# Patient Record
Sex: Male | Born: 1985 | Race: White | Hispanic: No | Smoking: Current every day smoker
Health system: Southern US, Community
[De-identification: ages and names within clinical notes are randomized; demographics above are authoritative.]

## PROBLEM LIST (undated history)

## (undated) DIAGNOSIS — F17200 Nicotine dependence, unspecified, uncomplicated: Secondary | ICD-10-CM

---
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
## 2007-10-06 ENCOUNTER — Emergency Department: Payer: Self-pay | Admitting: Emergency Medicine

## 2009-07-12 ENCOUNTER — Emergency Department: Payer: Self-pay | Admitting: Internal Medicine

## 2011-04-11 IMAGING — CR DG KNEE COMPLETE 4+V*L*
1 series · 5 of 5 positions shown · non-contrast
Comparison: none

REASON FOR EXAM: pain, fall
COMMENTS:

PROCEDURE:     DXR - DXR KNEE LT COMP WITH OBLIQUES  - July 12, 2009 [DATE]
RESULT:     No fracture, dislocation or other acute bony abnormality is
identified. The knee joint space is well maintained. The patella is intact.

[Series 1: view not recorded · 0.17mm/px · 5 of 5 slices shown]
[im 1/5]
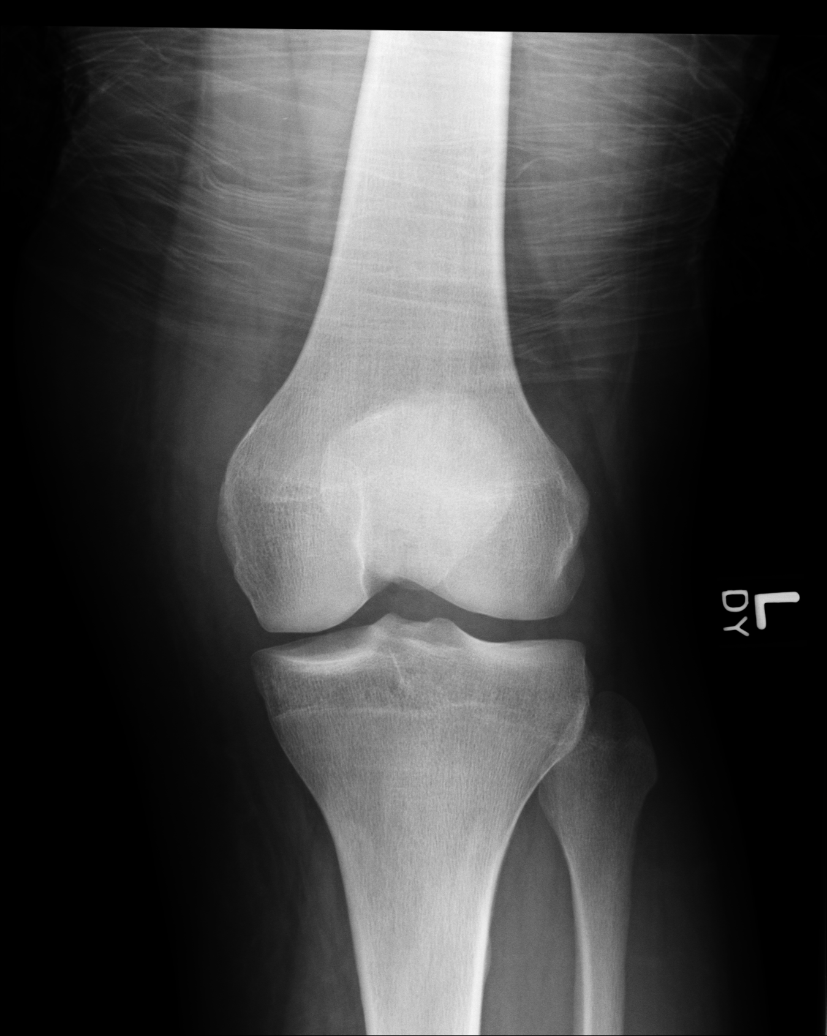
[im 2/5]
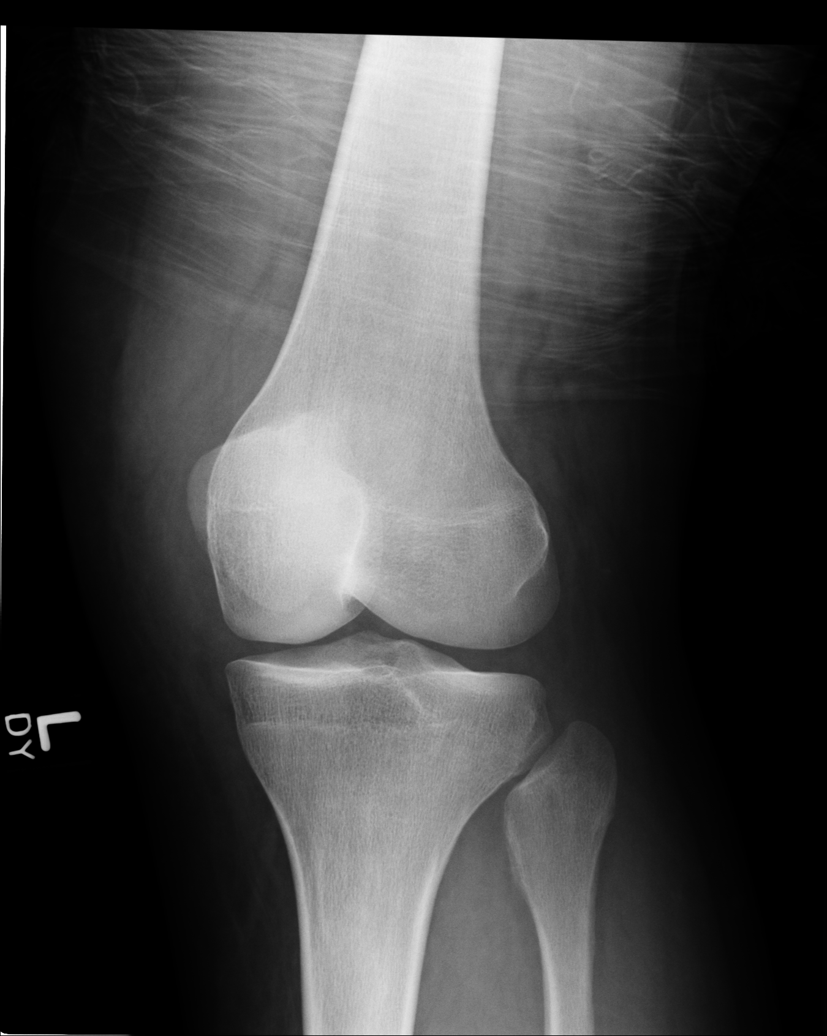
[im 3/5]
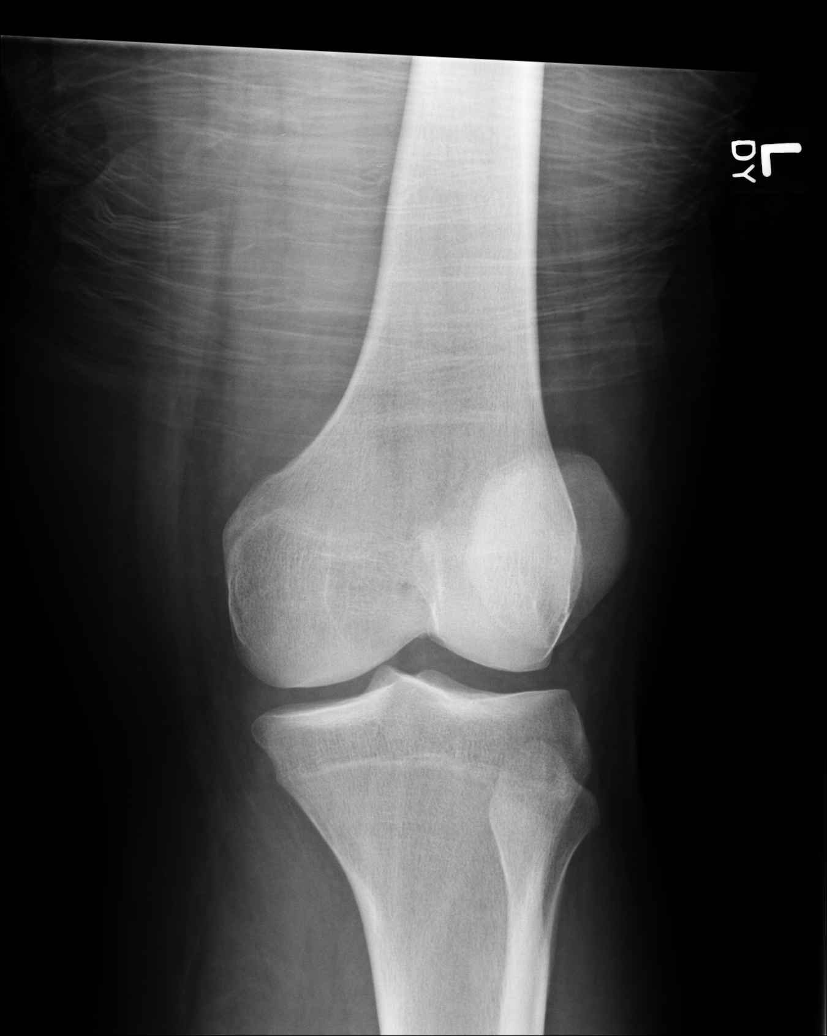
[im 4/5]
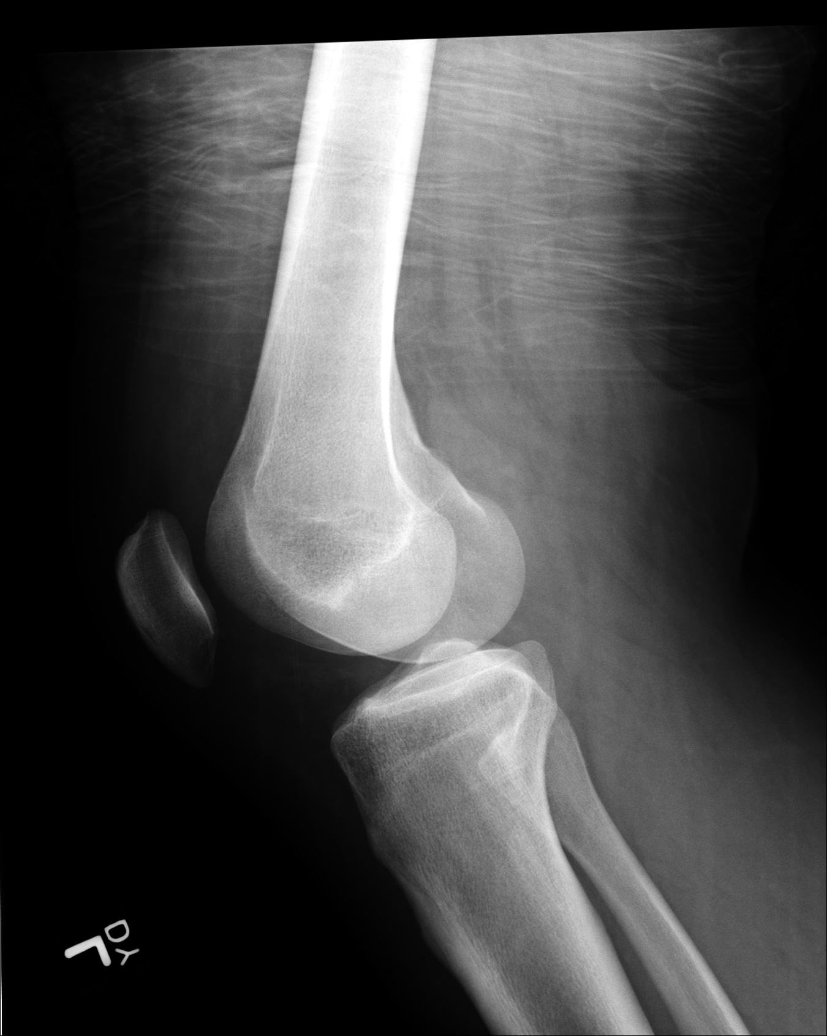
[im 5/5]
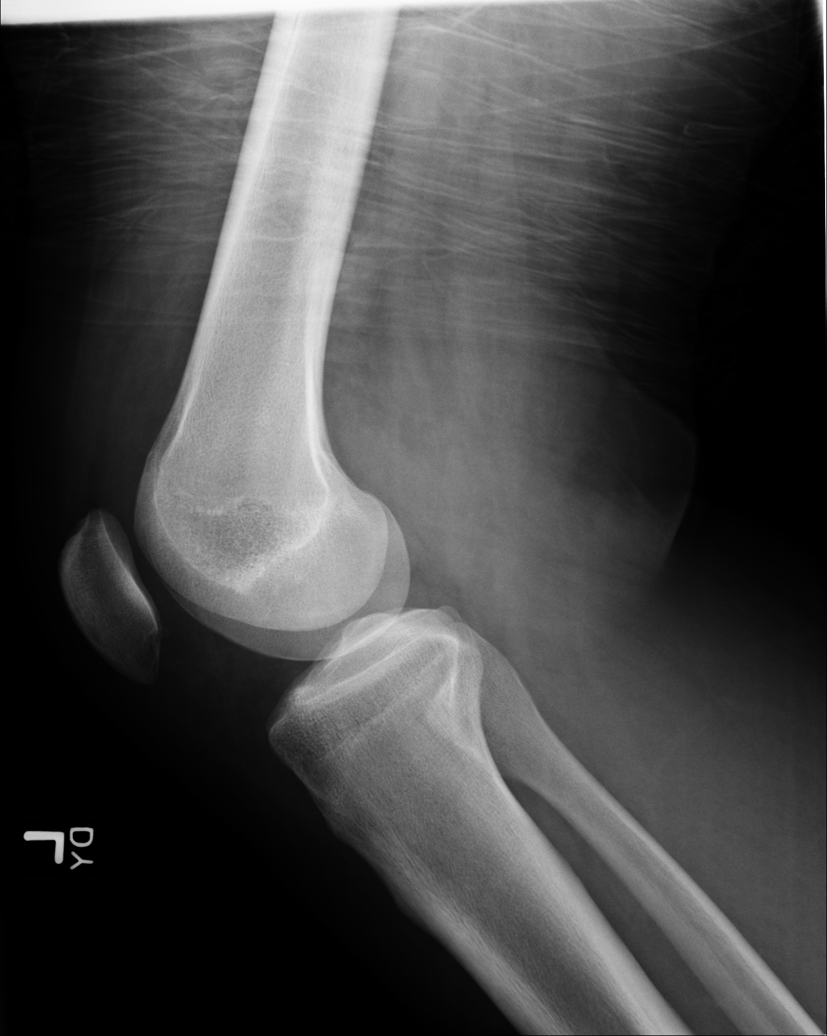

[5 of 5 positions shown; findings below may reference images not displayed]

IMPRESSION: 1.     No acute bony abnormalities are identified.

## 2015-12-06 ENCOUNTER — Encounter: Payer: Self-pay | Admitting: Emergency Medicine

## 2015-12-06 ENCOUNTER — Emergency Department: Payer: No Typology Code available for payment source

## 2015-12-06 ENCOUNTER — Emergency Department
Admission: EM | Admit: 2015-12-06 | Discharge: 2015-12-06 | Disposition: A | Discharge status: Home / Self Care | Payer: No Typology Code available for payment source | Attending: Student | Admitting: Student

## 2015-12-06 DIAGNOSIS — Y9355 Activity, bike riding: Secondary | ICD-10-CM | POA: Diagnosis not present

## 2015-12-06 DIAGNOSIS — Y92410 Unspecified street and highway as the place of occurrence of the external cause: Secondary | ICD-10-CM | POA: Diagnosis not present

## 2015-12-06 DIAGNOSIS — Y999 Unspecified external cause status: Secondary | ICD-10-CM | POA: Insufficient documentation

## 2015-12-06 DIAGNOSIS — F17200 Nicotine dependence, unspecified, uncomplicated: Secondary | ICD-10-CM | POA: Diagnosis not present

## 2015-12-06 DIAGNOSIS — S9031XA Contusion of right foot, initial encounter: Secondary | ICD-10-CM | POA: Diagnosis not present

## 2015-12-06 DIAGNOSIS — S99921A Unspecified injury of right foot, initial encounter: Secondary | ICD-10-CM | POA: Diagnosis present

## 2015-12-06 MED ORDER — MELOXICAM 15 MG PO TABS: 15.0000 mg | ORAL_TABLET | Freq: Every day | ORAL | 0 refills | Status: AC
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
## 2016-03-18 ENCOUNTER — Encounter: Payer: Self-pay | Admitting: Emergency Medicine

## 2016-03-18 ENCOUNTER — Emergency Department: Payer: No Typology Code available for payment source

## 2016-03-18 ENCOUNTER — Emergency Department
Admission: EM | Admit: 2016-03-18 | Discharge: 2016-03-18 | Disposition: A | Discharge status: Home / Self Care | Payer: No Typology Code available for payment source | Attending: Emergency Medicine | Admitting: Emergency Medicine

## 2016-03-18 DIAGNOSIS — S5002XA Contusion of left elbow, initial encounter: Secondary | ICD-10-CM

## 2016-03-18 DIAGNOSIS — S161XXA Strain of muscle, fascia and tendon at neck level, initial encounter: Secondary | ICD-10-CM | POA: Insufficient documentation

## 2016-03-18 DIAGNOSIS — S060X9A Concussion with loss of consciousness of unspecified duration, initial encounter: Secondary | ICD-10-CM | POA: Diagnosis not present

## 2016-03-18 DIAGNOSIS — F17210 Nicotine dependence, cigarettes, uncomplicated: Secondary | ICD-10-CM | POA: Insufficient documentation

## 2016-03-18 DIAGNOSIS — Y92410 Unspecified street and highway as the place of occurrence of the external cause: Secondary | ICD-10-CM | POA: Diagnosis not present

## 2016-03-18 DIAGNOSIS — Z791 Long term (current) use of non-steroidal anti-inflammatories (NSAID): Secondary | ICD-10-CM | POA: Diagnosis not present

## 2016-03-18 DIAGNOSIS — Y9355 Activity, bike riding: Secondary | ICD-10-CM | POA: Diagnosis not present

## 2016-03-18 DIAGNOSIS — S40012A Contusion of left shoulder, initial encounter: Secondary | ICD-10-CM | POA: Diagnosis not present

## 2016-03-18 DIAGNOSIS — S0990XA Unspecified injury of head, initial encounter: Secondary | ICD-10-CM | POA: Diagnosis present

## 2016-03-18 DIAGNOSIS — S7002XA Contusion of left hip, initial encounter: Secondary | ICD-10-CM | POA: Insufficient documentation

## 2016-03-18 DIAGNOSIS — Y999 Unspecified external cause status: Secondary | ICD-10-CM | POA: Diagnosis not present

## 2016-03-18 MED ORDER — OXYCODONE-ACETAMINOPHEN 5-325 MG PO TABS: 1.0000 | ORAL_TABLET | Freq: Four times a day (QID) | ORAL | 0 refills | Status: AC | PRN

## 2016-03-18 MED ORDER — OXYCODONE-ACETAMINOPHEN 5-325 MG PO TABS
ORAL_TABLET | ORAL | Status: AC
  Administered 2016-03-18: 1 via ORAL
  Filled 2016-03-18: qty 1

## 2016-03-18 MED ORDER — OXYCODONE-ACETAMINOPHEN 5-325 MG PO TABS
1.0000 | ORAL_TABLET | Freq: Once | ORAL | Status: AC
  Administered 2016-03-18: 1 via ORAL

## 2016-03-18 MED ORDER — IBUPROFEN 800 MG PO TABS: 800.0000 mg | ORAL_TABLET | Freq: Three times a day (TID) | ORAL | 0 refills | Status: AC | PRN

## 2016-03-18 MED ORDER — CYCLOBENZAPRINE HCL 10 MG PO TABS
ORAL_TABLET | ORAL | Status: AC
  Administered 2016-03-18: 10 mg via ORAL
  Filled 2016-03-18: qty 1

## 2016-03-18 MED ORDER — CYCLOBENZAPRINE HCL 5 MG PO TABS: 5.0000 mg | ORAL_TABLET | Freq: Three times a day (TID) | ORAL | 0 refills | Status: AC | PRN

## 2016-03-18 MED ORDER — CYCLOBENZAPRINE HCL 10 MG PO TABS
10.0000 mg | ORAL_TABLET | Freq: Once | ORAL | Status: AC
  Administered 2016-03-18: 10 mg via ORAL

## 2016-03-18 NOTE — Discharge Instructions (Signed)
Please take medications as prescribed. Ice elbow, shoulder, hip 20 minutes every hour as needed for the next 2-3 days. Wear sling for 2-3 days as needed for comfort. Follow-up with orthopedics if no improvement in 5-7 days. Return to the ER for any worsening symptoms urgent changes in her health.
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
## 2016-07-17 ENCOUNTER — Encounter (HOSPITAL_COMMUNITY): Payer: Self-pay

## 2016-07-17 ENCOUNTER — Emergency Department (HOSPITAL_COMMUNITY): Payer: Medicaid Other

## 2016-07-17 ENCOUNTER — Inpatient Hospital Stay (HOSPITAL_COMMUNITY)
Admission: EM | Admit: 2016-07-17 | Discharge: 2016-08-15 | DRG: 987 | Disposition: E | Payer: Medicaid Other | Attending: General Surgery | Admitting: General Surgery

## 2016-07-17 DIAGNOSIS — S069X9A Unspecified intracranial injury with loss of consciousness of unspecified duration, initial encounter: Secondary | ICD-10-CM

## 2016-07-17 DIAGNOSIS — R402112 Coma scale, eyes open, never, at arrival to emergency department: Secondary | ICD-10-CM | POA: Diagnosis present

## 2016-07-17 DIAGNOSIS — S065X9A Traumatic subdural hemorrhage with loss of consciousness of unspecified duration, initial encounter: Secondary | ICD-10-CM

## 2016-07-17 DIAGNOSIS — J9601 Acute respiratory failure with hypoxia: Secondary | ICD-10-CM | POA: Diagnosis present

## 2016-07-17 DIAGNOSIS — Z529 Donor of unspecified organ or tissue: Secondary | ICD-10-CM

## 2016-07-17 DIAGNOSIS — S065XAA Traumatic subdural hemorrhage with loss of consciousness status unknown, initial encounter: Secondary | ICD-10-CM

## 2016-07-17 DIAGNOSIS — I609 Nontraumatic subarachnoid hemorrhage, unspecified: Secondary | ICD-10-CM

## 2016-07-17 DIAGNOSIS — S069XAA Unspecified intracranial injury with loss of consciousness status unknown, initial encounter: Secondary | ICD-10-CM

## 2016-07-17 DIAGNOSIS — W3400XA Accidental discharge from unspecified firearms or gun, initial encounter: Secondary | ICD-10-CM | POA: Diagnosis not present

## 2016-07-17 DIAGNOSIS — G936 Cerebral edema: Secondary | ICD-10-CM | POA: Diagnosis present

## 2016-07-17 DIAGNOSIS — S069X6A Unspecified intracranial injury with loss of consciousness greater than 24 hours without return to pre-existing conscious level with patient surviving, initial encounter: Secondary | ICD-10-CM

## 2016-07-17 DIAGNOSIS — R402212 Coma scale, best verbal response, none, at arrival to emergency department: Secondary | ICD-10-CM | POA: Diagnosis present

## 2016-07-17 DIAGNOSIS — T17590A Other foreign object in bronchus causing asphyxiation, initial encounter: Secondary | ICD-10-CM | POA: Diagnosis present

## 2016-07-17 DIAGNOSIS — R402312 Coma scale, best motor response, none, at arrival to emergency department: Secondary | ICD-10-CM | POA: Diagnosis present

## 2016-07-17 DIAGNOSIS — S061X9A Traumatic cerebral edema with loss of consciousness of unspecified duration, initial encounter: Secondary | ICD-10-CM | POA: Diagnosis present

## 2016-07-17 DIAGNOSIS — S0193XA Puncture wound without foreign body of unspecified part of head, initial encounter: Secondary | ICD-10-CM

## 2016-07-17 DIAGNOSIS — G9382 Brain death: Secondary | ICD-10-CM | POA: Diagnosis present

## 2016-07-17 HISTORY — DX: Nicotine dependence, unspecified, uncomplicated: F17.200

## 2016-07-17 LAB — CDS SEROLOGY

## 2016-07-17 LAB — TYPE AND SCREEN
ABO/RH(D): O POS
Antibody Screen: NEGATIVE
UNIT DIVISION: 0
Unit division: 0

## 2016-07-17 LAB — COMPREHENSIVE METABOLIC PANEL
ALT: 42 U/L (ref 17–63)
ANION GAP: 7 (ref 5–15)
AST: 32 U/L (ref 15–41)
Albumin: 3.5 g/dL (ref 3.5–5.0)
Alkaline Phosphatase: 47 U/L (ref 38–126)
BUN: 15 mg/dL (ref 6–20)
CALCIUM: 8.1 mg/dL — AB (ref 8.9–10.3)
CHLORIDE: 109 mmol/L (ref 101–111)
CO2: 24 mmol/L (ref 22–32)
Creatinine, Ser: 0.88 mg/dL (ref 0.61–1.24)
Glucose, Bld: 124 mg/dL — ABNORMAL HIGH (ref 65–99)
Potassium: 4 mmol/L (ref 3.5–5.1)
SODIUM: 140 mmol/L (ref 135–145)
Total Bilirubin: 0.9 mg/dL (ref 0.3–1.2)
Total Protein: 5.9 g/dL — ABNORMAL LOW (ref 6.5–8.1)

## 2016-07-17 LAB — URINALYSIS, ROUTINE W REFLEX MICROSCOPIC
Bilirubin Urine: NEGATIVE
GLUCOSE, UA: NEGATIVE mg/dL
HGB URINE DIPSTICK: NEGATIVE
Ketones, ur: 20 mg/dL — AB
LEUKOCYTES UA: NEGATIVE
NITRITE: NEGATIVE
PH: 5 (ref 5.0–8.0)
Protein, ur: 100 mg/dL — AB
SPECIFIC GRAVITY, URINE: 1.028 (ref 1.005–1.030)

## 2016-07-17 LAB — CBC
HCT: 42.5 % (ref 39.0–52.0)
HEMOGLOBIN: 13.9 g/dL (ref 13.0–17.0)
MCH: 28.5 pg (ref 26.0–34.0)
MCHC: 32.7 g/dL (ref 30.0–36.0)
MCV: 87.1 fL (ref 78.0–100.0)
PLATELETS: 163 10*3/uL (ref 150–400)
RBC: 4.88 MIL/uL (ref 4.22–5.81)
RDW: 14.2 % (ref 11.5–15.5)
WBC: 14.9 10*3/uL — AB (ref 4.0–10.5)

## 2016-07-17 LAB — BPAM RBC
BLOOD PRODUCT EXPIRATION DATE: 201804252359
BLOOD PRODUCT EXPIRATION DATE: 201804252359
ISSUE DATE / TIME: 201804021105
ISSUE DATE / TIME: 201804021105
UNIT TYPE AND RH: 9500
Unit Type and Rh: 9500

## 2016-07-17 LAB — PROTIME-INR
INR: 1.25
Prothrombin Time: 15.8 seconds — ABNORMAL HIGH (ref 11.4–15.2)

## 2016-07-17 LAB — PREPARE FRESH FROZEN PLASMA
Unit division: 0
Unit division: 0

## 2016-07-17 LAB — BLOOD GAS, ARTERIAL
Acid-base deficit: 2.3 mmol/L — ABNORMAL HIGH (ref 0.0–2.0)
Bicarbonate: 21 mmol/L (ref 20.0–28.0)
DRAWN BY: 40662
FIO2: 50
MECHVT: 500 mL
O2 Saturation: 99.3 %
PEEP: 5 cmH2O
Patient temperature: 97.8
RATE: 18 resp/min
pCO2 arterial: 29.3 mmHg — ABNORMAL LOW (ref 32.0–48.0)
pH, Arterial: 7.466 — ABNORMAL HIGH (ref 7.350–7.450)
pO2, Arterial: 162 mmHg — ABNORMAL HIGH (ref 83.0–108.0)

## 2016-07-17 LAB — I-STAT ARTERIAL BLOOD GAS, ED
Acid-base deficit: 3 mmol/L — ABNORMAL HIGH (ref 0.0–2.0)
Bicarbonate: 23.6 mmol/L (ref 20.0–28.0)
O2 Saturation: 100 %
PH ART: 7.331 — AB (ref 7.350–7.450)
TCO2: 25 mmol/L (ref 0–100)
pCO2 arterial: 44.2 mmHg (ref 32.0–48.0)
pO2, Arterial: 482 mmHg — ABNORMAL HIGH (ref 83.0–108.0)

## 2016-07-17 LAB — I-STAT CHEM 8, ED
BUN: 19 mg/dL (ref 6–20)
CALCIUM ION: 1.11 mmol/L — AB (ref 1.15–1.40)
CREATININE: 0.8 mg/dL (ref 0.61–1.24)
Chloride: 105 mmol/L (ref 101–111)
GLUCOSE: 125 mg/dL — AB (ref 65–99)
HCT: 40 % (ref 39.0–52.0)
HEMOGLOBIN: 13.6 g/dL (ref 13.0–17.0)
Potassium: 4 mmol/L (ref 3.5–5.1)
Sodium: 140 mmol/L (ref 135–145)
TCO2: 25 mmol/L (ref 0–100)

## 2016-07-17 LAB — TRIGLYCERIDES: TRIGLYCERIDES: 59 mg/dL (ref <150)

## 2016-07-17 LAB — MRSA PCR SCREENING: MRSA BY PCR: POSITIVE — AB

## 2016-07-17 LAB — BPAM FFP
Blood Product Expiration Date: 201804072359
Blood Product Expiration Date: 201804072359
ISSUE DATE / TIME: 201804021106
ISSUE DATE / TIME: 201804021106
UNIT TYPE AND RH: 6200
Unit Type and Rh: 6200

## 2016-07-17 LAB — SURGICAL PCR SCREEN
MRSA, PCR: POSITIVE — AB
Staphylococcus aureus: POSITIVE — AB

## 2016-07-17 LAB — I-STAT CG4 LACTIC ACID, ED: Lactic Acid, Venous: 1.04 mmol/L (ref 0.5–1.9)

## 2016-07-17 LAB — ABO/RH: ABO/RH(D): O POS

## 2016-07-17 LAB — ETHANOL

## 2016-07-17 MED ORDER — ONDANSETRON HCL 4 MG/2ML IJ SOLN: 4.0000 mg | Freq: Four times a day (QID) | INTRAMUSCULAR | Status: DC | PRN

## 2016-07-17 MED ORDER — ETOMIDATE 2 MG/ML IV SOLN
INTRAVENOUS | Status: AC | PRN
  Administered 2016-07-17: 20 mg via INTRAVENOUS

## 2016-07-17 MED ORDER — SODIUM CHLORIDE 0.9 % IV SOLN
0.0000 ug/min | INTRAVENOUS | Status: DC
  Administered 2016-07-17: 20 ug/min via INTRAVENOUS
  Administered 2016-07-18: 120 ug/min via INTRAVENOUS
  Administered 2016-07-18 (×2): 80 ug/min via INTRAVENOUS
  Administered 2016-07-18: 50 ug/min via INTRAVENOUS
  Administered 2016-07-18: 90 ug/min via INTRAVENOUS
  Administered 2016-07-18: 120 ug/min via INTRAVENOUS
  Filled 2016-07-17 (×7): qty 1

## 2016-07-17 MED ORDER — MUPIROCIN 2 % EX OINT
1.0000 "application " | TOPICAL_OINTMENT | Freq: Two times a day (BID) | CUTANEOUS | Status: DC
  Administered 2016-07-17 – 2016-07-19 (×5): 1 via NASAL
  Filled 2016-07-17: qty 22

## 2016-07-17 MED ORDER — ATROPINE SULFATE 1 MG/ML IJ SOLN
INTRAMUSCULAR | Status: AC | PRN
  Administered 2016-07-17: 1 mg via INTRAVENOUS

## 2016-07-17 MED ORDER — CHLORHEXIDINE GLUCONATE 0.12% ORAL RINSE (MEDLINE KIT)
15.0000 mL | Freq: Two times a day (BID) | OROMUCOSAL | Status: DC
  Administered 2016-07-17 – 2016-07-19 (×4): 15 mL via OROMUCOSAL

## 2016-07-17 MED ORDER — PANTOPRAZOLE SODIUM 40 MG IV SOLR
40.0000 mg | Freq: Every day | INTRAVENOUS | Status: DC
  Administered 2016-07-17 – 2016-07-18 (×2): 40 mg via INTRAVENOUS
  Filled 2016-07-17 (×2): qty 40

## 2016-07-17 MED ORDER — SODIUM CHLORIDE 0.9 % IV SOLN
INTRAVENOUS | Status: DC
  Administered 2016-07-17 – 2016-07-18 (×4): via INTRAVENOUS

## 2016-07-17 MED ORDER — CHLORHEXIDINE GLUCONATE CLOTH 2 % EX PADS
6.0000 | MEDICATED_PAD | Freq: Every day | CUTANEOUS | Status: DC
  Administered 2016-07-18 – 2016-07-19 (×2): 6 via TOPICAL

## 2016-07-17 MED ORDER — FENTANYL CITRATE (PF) 100 MCG/2ML IJ SOLN: 100.0000 ug | INTRAMUSCULAR | Status: DC | PRN

## 2016-07-17 MED ORDER — ORAL CARE MOUTH RINSE: 15.0000 mL | Freq: Four times a day (QID) | OROMUCOSAL | Status: DC

## 2016-07-17 MED ORDER — SODIUM CHLORIDE 0.9 % IV SOLN
INTRAVENOUS | Status: AC | PRN
  Administered 2016-07-17: 2000 mL via INTRAVENOUS

## 2016-07-17 MED ORDER — PANTOPRAZOLE SODIUM 40 MG PO TBEC: 40.0000 mg | DELAYED_RELEASE_TABLET | Freq: Every day | ORAL | Status: DC

## 2016-07-17 MED ORDER — PROPOFOL 1000 MG/100ML IV EMUL: 0.0000 ug/kg/min | INTRAVENOUS | Status: DC

## 2016-07-17 MED ORDER — ONDANSETRON HCL 4 MG PO TABS: 4.0000 mg | ORAL_TABLET | Freq: Four times a day (QID) | ORAL | Status: DC | PRN

## 2016-07-17 MED ORDER — SODIUM CHLORIDE 0.9 % IV BOLUS (SEPSIS)
1000.0000 mL | Freq: Once | INTRAVENOUS | Status: AC
  Administered 2016-07-17: 1000 mL via INTRAVENOUS

## 2016-07-17 MED ORDER — ORAL CARE MOUTH RINSE
15.0000 mL | OROMUCOSAL | Status: DC
  Administered 2016-07-17 – 2016-07-19 (×21): 15 mL via OROMUCOSAL

## 2016-07-17 MED ORDER — SUCCINYLCHOLINE CHLORIDE 20 MG/ML IJ SOLN
INTRAMUSCULAR | Status: AC | PRN
  Administered 2016-07-17: 100 mg via INTRAVENOUS

## 2016-07-17 NOTE — Clinical Social Work Note (Signed)
Clinical Social Worker present with RN, patient mother Taylor Steele) and patient spouse Taylor Steele) who states that patient and patient spouse are no longer together but remain legally married.  Patient spouse, Taylor Steele provides verbal permission for patient mother to be primary contact and decision maker regarding patient care.  RN (Sarah [employer]) witnessed conversation.  CSW to update MD.  CSW remains available for support.  [name], [place] [phone]

## 2016-07-17 NOTE — ED Notes (Signed)
Pt returned from ct

## 2016-07-17 NOTE — Progress Notes (Signed)
Patient transported from the ED to room 3M12 without any apparent complications.
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
## 2016-07-17 NOTE — Progress Notes (Signed)
Pt arrived to 3M12 from ED on ventilator. Pt stable at this time. VS within normal limits. RT will continue to monitor.
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
## 2016-07-17 NOTE — Progress Notes (Signed)
Patient ID: Taylor Steele, male   DOB: December 13, 1985, 31 y.o.   MRN: [id number]   Patient's family at bedside.  He is becoming increasingly tachycardic and hypotensive.  Despite his grim prognosis, the family wants him to remain a full code.

## 2016-07-17 NOTE — ED Notes (Signed)
[place] pd at bedside
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
## 2016-07-17 NOTE — Progress Notes (Signed)
Patient temp 94.4ax. Bear hugger applied. Taylor Steele, Taylor Scrape, RN
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
## 2016-07-17 NOTE — Progress Notes (Signed)
Patient with change in neuro status. Does not withdraw to pain in any extremities at this point. No cough, gag, or corneals present. HR sustaining in the 130's and BP in the 200's. Trauma MD paged. Amman Bartel, [place] Scrape, RN
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
## 2016-07-17 NOTE — ED Notes (Signed)
Per dr Janee Morn neurosurgery will see, request page if patient brady's down, VO for 1 amp of atropine if the patient becomes bradycardic again, nurse on 60M notified in report

## 2016-07-18 ENCOUNTER — Other Ambulatory Visit (HOSPITAL_COMMUNITY): Payer: Medicaid Other

## 2016-07-18 ENCOUNTER — Encounter (HOSPITAL_COMMUNITY): Payer: Self-pay

## 2016-07-18 ENCOUNTER — Inpatient Hospital Stay (HOSPITAL_COMMUNITY): Payer: Medicaid Other

## 2016-07-18 DIAGNOSIS — J9601 Acute respiratory failure with hypoxia: Secondary | ICD-10-CM

## 2016-07-18 LAB — BLOOD PRODUCT ORDER (VERBAL) VERIFICATION

## 2016-07-18 LAB — URINALYSIS, COMPLETE (UACMP) WITH MICROSCOPIC
Bilirubin Urine: NEGATIVE
GLUCOSE, UA: NEGATIVE mg/dL
Ketones, ur: NEGATIVE mg/dL
Nitrite: NEGATIVE
PH: 5 (ref 5.0–8.0)
Protein, ur: NEGATIVE mg/dL
Specific Gravity, Urine: 1.012 (ref 1.005–1.030)
Squamous Epithelial / LPF: NONE SEEN

## 2016-07-18 LAB — CREATININE, SERUM
Creatinine, Ser: 1.07 mg/dL (ref 0.61–1.24)
GFR calc non Af Amer: >60 mL/min (ref >60)

## 2016-07-18 LAB — CBC
HCT: 36.4 % — ABNORMAL LOW (ref 39.0–52.0)
HCT: 33 % — ABNORMAL LOW (ref 39.0–52.0)
HEMOGLOBIN: 10.5 g/dL — AB (ref 13.0–17.0)
Hemoglobin: 11.7 g/dL — ABNORMAL LOW (ref 13.0–17.0)
MCH: 28.1 pg (ref 26.0–34.0)
MCH: 28.2 pg (ref 26.0–34.0)
MCHC: 32.1 g/dL (ref 30.0–36.0)
MCHC: 31.8 g/dL (ref 30.0–36.0)
MCV: 87.5 fL (ref 78.0–100.0)
MCV: 88.7 fL (ref 78.0–100.0)
PLATELETS: 135 10*3/uL — AB (ref 150–400)
PLATELETS: 115 10*3/uL — AB (ref 150–400)
RBC: 4.16 MIL/uL — AB (ref 4.22–5.81)
RBC: 3.72 MIL/uL — ABNORMAL LOW (ref 4.22–5.81)
RDW: 14.5 % (ref 11.5–15.5)
RDW: 14.8 % (ref 11.5–15.5)
WBC: 12.5 10*3/uL — ABNORMAL HIGH (ref 4.0–10.5)
WBC: 14 10*3/uL — ABNORMAL HIGH (ref 4.0–10.5)

## 2016-07-18 LAB — GAMMA GT: GGT: 14 U/L (ref 7–50)

## 2016-07-18 LAB — SODIUM: SODIUM: 148 mmol/L — AB (ref 135–145)

## 2016-07-18 LAB — DIFFERENTIAL
BASOS PCT: 0 %
Basophils Absolute: 0.1 10*3/uL (ref 0.0–0.1)
EOS ABS: 0.3 10*3/uL (ref 0.0–0.7)
Eosinophils Relative: 2 %
LYMPHS ABS: 1.9 10*3/uL (ref 0.7–4.0)
Lymphocytes Relative: 14 %
Monocytes Absolute: 1.3 10*3/uL — ABNORMAL HIGH (ref 0.1–1.0)
Monocytes Relative: 10 %
NEUTROS PCT: 75 %
Neutro Abs: 10.4 10*3/uL — ABNORMAL HIGH (ref 1.7–7.7)

## 2016-07-18 LAB — BILIRUBIN, DIRECT: Bilirubin, Direct: 0.2 mg/dL (ref 0.1–0.5)

## 2016-07-18 LAB — BASIC METABOLIC PANEL
Anion gap: 6 (ref 5–15)
BUN: 16 mg/dL (ref 6–20)
CALCIUM: 8 mg/dL — AB (ref 8.9–10.3)
CO2: 25 mmol/L (ref 22–32)
CREATININE: 1.03 mg/dL (ref 0.61–1.24)
Chloride: 115 mmol/L — ABNORMAL HIGH (ref 101–111)
GFR calc Af Amer: >60 mL/min (ref >60)
Glucose, Bld: 100 mg/dL — ABNORMAL HIGH (ref 65–99)
POTASSIUM: 4 mmol/L (ref 3.5–5.1)
SODIUM: 146 mmol/L — AB (ref 135–145)

## 2016-07-18 LAB — PROTIME-INR
INR: 1.38
PROTHROMBIN TIME: 17 s — AB (ref 11.4–15.2)

## 2016-07-18 LAB — POCT I-STAT 3, ART BLOOD GAS (G3+)
Acid-base deficit: 6 mmol/L — ABNORMAL HIGH (ref 0.0–2.0)
Bicarbonate: 17.9 mmol/L — ABNORMAL LOW (ref 20.0–28.0)
O2 SAT: 89 %
PCO2 ART: 29.9 mmHg — AB (ref 32.0–48.0)
PH ART: 7.389 (ref 7.350–7.450)
PO2 ART: 59 mmHg — AB (ref 83.0–108.0)
Patient temperature: 37.7
TCO2: 19 mmol/L (ref 0–100)

## 2016-07-18 LAB — POTASSIUM: Potassium: 3.2 mmol/L — ABNORMAL LOW (ref 3.5–5.1)

## 2016-07-18 LAB — PREPARE RBC (CROSSMATCH)

## 2016-07-18 LAB — GLUCOSE, CAPILLARY: GLUCOSE-CAPILLARY: 84 mg/dL (ref 65–99)

## 2016-07-18 LAB — LACTIC ACID, PLASMA: Lactic Acid, Venous: 0.9 mmol/L (ref 0.5–1.9)

## 2016-07-18 LAB — LACTATE DEHYDROGENASE: LDH: 208 U/L — ABNORMAL HIGH (ref 98–192)

## 2016-07-18 LAB — ALT: ALT: 23 U/L (ref 17–63)

## 2016-07-18 LAB — CO2, TOTAL: CO2: 21 mmol/L — AB (ref 22–32)

## 2016-07-18 LAB — HIV ANTIBODY (ROUTINE TESTING W REFLEX): HIV SCREEN 4TH GENERATION: NONREACTIVE

## 2016-07-18 LAB — FIBRINOGEN: Fibrinogen: 435 mg/dL (ref 210–475)

## 2016-07-18 LAB — CHLORIDE: CHLORIDE: 123 mmol/L — AB (ref 101–111)

## 2016-07-18 LAB — CALCIUM: Calcium: 8.1 mg/dL — ABNORMAL LOW (ref 8.9–10.3)

## 2016-07-18 LAB — ALKALINE PHOSPHATASE: ALK PHOS: 36 U/L — AB (ref 38–126)

## 2016-07-18 LAB — AST: AST: 23 U/L (ref 15–41)

## 2016-07-18 LAB — LIPASE, BLOOD: LIPASE: 12 U/L (ref 11–51)

## 2016-07-18 LAB — BUN: BUN: 14 mg/dL (ref 6–20)

## 2016-07-18 LAB — CK TOTAL AND CKMB (NOT AT ARMC)
CK TOTAL: 234 U/L (ref 49–397)
CK, MB: 1.8 ng/mL (ref 0.5–5.0)
Relative Index: 0.8 (ref 0.0–2.5)

## 2016-07-18 LAB — PROTEIN, TOTAL: TOTAL PROTEIN: 5 g/dL — AB (ref 6.5–8.1)

## 2016-07-18 LAB — BILIRUBIN, TOTAL: Total Bilirubin: 1.2 mg/dL (ref 0.3–1.2)

## 2016-07-18 LAB — PHOSPHORUS: Phosphorus: 1.1 mg/dL — ABNORMAL LOW (ref 2.5–4.6)

## 2016-07-18 LAB — AMYLASE: Amylase: 24 U/L — ABNORMAL LOW (ref 28–100)

## 2016-07-18 LAB — GLUCOSE, RANDOM: GLUCOSE: 81 mg/dL (ref 65–99)

## 2016-07-18 LAB — MAGNESIUM: Magnesium: 1.9 mg/dL (ref 1.7–2.4)

## 2016-07-18 LAB — APTT: APTT: 35 s (ref 24–36)

## 2016-07-18 LAB — ALBUMIN: Albumin: 2.6 g/dL — ABNORMAL LOW (ref 3.5–5.0)

## 2016-07-18 LAB — TROPONIN I

## 2016-07-18 MED ORDER — SODIUM CHLORIDE 0.9 % IV SOLN
0.0000 ug/min | INTRAVENOUS | Status: DC
  Administered 2016-07-18: 130 ug/min via INTRAVENOUS
  Administered 2016-07-18: 110 ug/min via INTRAVENOUS
  Administered 2016-07-18: 170 ug/min via INTRAVENOUS
  Administered 2016-07-19: 150 ug/min via INTRAVENOUS
  Filled 2016-07-18 (×5): qty 2

## 2016-07-18 MED ORDER — SODIUM CHLORIDE 0.9 % IV SOLN: Freq: Once | INTRAVENOUS | Status: DC

## 2016-07-18 MED ORDER — FLUCONAZOLE IN SODIUM CHLORIDE 400-0.9 MG/200ML-% IV SOLN
400.0000 mg | INTRAVENOUS | Status: AC
  Administered 2016-07-19: 400 mg via INTRAVENOUS
  Filled 2016-07-18: qty 200

## 2016-07-18 MED ORDER — SODIUM CHLORIDE 0.9 % IV SOLN
10.0000 ug/h | INTRAVENOUS | Status: DC
  Administered 2016-07-18: 10 ug/h via INTRAVENOUS
  Administered 2016-07-19: 20 ug/h via INTRAVENOUS
  Filled 2016-07-18 (×3): qty 10

## 2016-07-18 MED ORDER — SODIUM CHLORIDE 0.9 % IV SOLN
2000.0000 mg | Freq: Once | INTRAVENOUS | Status: DC
  Filled 2016-07-18: qty 16

## 2016-07-18 MED ORDER — CIPROFLOXACIN IN D5W 400 MG/200ML IV SOLN
400.0000 mg | INTRAVENOUS | Status: AC
  Administered 2016-07-18: 400 mg via INTRAVENOUS
  Filled 2016-07-18: qty 200

## 2016-07-18 MED ORDER — VANCOMYCIN HCL 10 G IV SOLR
1500.0000 mg | INTRAVENOUS | Status: AC
  Administered 2016-07-19: 1500 mg via INTRAVENOUS
  Filled 2016-07-18: qty 1500

## 2016-07-18 MED ORDER — POTASSIUM PHOSPHATES 15 MMOLE/5ML IV SOLN
20.0000 meq | Freq: Once | INTRAVENOUS | Status: AC
Start: 2016-07-18 — End: 2016-07-19
  Administered 2016-07-18: 20 meq via INTRAVENOUS
  Filled 2016-07-18: qty 4.55

## 2016-07-18 MED ORDER — DEXTROSE 50 % IV SOLN
50.0000 mL | Freq: Once | INTRAVENOUS | Status: AC
  Administered 2016-07-18: 50 mL via INTRAVENOUS
  Filled 2016-07-18: qty 50

## 2016-07-18 MED ORDER — INSULIN ASPART 100 UNIT/ML ~~LOC~~ SOLN
20.0000 [IU] | Freq: Once | SUBCUTANEOUS | Status: AC
  Administered 2016-07-18: 20 [IU] via SUBCUTANEOUS

## 2016-07-18 MED ORDER — LEVOTHYROXINE SODIUM 100 MCG IV SOLR
20.0000 ug | Freq: Once | INTRAVENOUS | Status: AC
  Administered 2016-07-18: 20 ug via INTRAVENOUS
  Filled 2016-07-18: qty 5

## 2016-07-18 MED ORDER — SODIUM CHLORIDE 0.45 % IV SOLN
INTRAVENOUS | Status: DC
  Administered 2016-07-18 – 2016-07-19 (×3): via INTRAVENOUS

## 2016-07-18 MED ORDER — SODIUM CHLORIDE 0.9 % IV BOLUS (SEPSIS)
1000.0000 mL | Freq: Once | INTRAVENOUS | Status: AC
  Administered 2016-07-18: 1000 mL via INTRAVENOUS

## 2016-07-18 MED ORDER — SODIUM CHLORIDE 0.9 % IV SOLN
20.0000 ug | Freq: Once | INTRAVENOUS | Status: AC
  Administered 2016-07-18: 20 ug via INTRAVENOUS
  Filled 2016-07-18: qty 5

## 2016-07-18 MED ORDER — SODIUM CHLORIDE 0.9 % IV SOLN
2000.0000 mg | Freq: Once | INTRAVENOUS | Status: AC
  Administered 2016-07-18: 2000 mg via INTRAVENOUS
  Filled 2016-07-18: qty 16

## 2016-07-18 MED ORDER — TECHNETIUM TC 99M EXAMETAZIME IV KIT
19.0000 | PACK | Freq: Once | INTRAVENOUS | Status: AC | PRN
  Administered 2016-07-18: 19 via INTRAVENOUS

## 2016-07-18 MED ORDER — MAGNESIUM SULFATE IN D5W 1-5 GM/100ML-% IV SOLN
1.0000 g | Freq: Once | INTRAVENOUS | Status: AC
  Administered 2016-07-18: 1 g via INTRAVENOUS
  Filled 2016-07-18: qty 100

## 2016-07-19 ENCOUNTER — Other Ambulatory Visit (HOSPITAL_COMMUNITY): Payer: Medicaid Other

## 2016-07-19 ENCOUNTER — Encounter (HOSPITAL_COMMUNITY): Admission: EM | Disposition: E | Payer: Self-pay | Source: Home / Self Care

## 2016-07-19 ENCOUNTER — Encounter (HOSPITAL_COMMUNITY): Payer: Self-pay | Admitting: General Practice

## 2016-07-19 DIAGNOSIS — Z5289 Donor of other specified organs or tissues: Secondary | ICD-10-CM

## 2016-07-19 HISTORY — PX: ORGAN PROCUREMENT: SHX5270

## 2016-07-19 LAB — BASIC METABOLIC PANEL
ANION GAP: 7 (ref 5–15)
ANION GAP: 6 (ref 5–15)
BUN: 12 mg/dL (ref 6–20)
BUN: 10 mg/dL (ref 6–20)
CALCIUM: 8.2 mg/dL — AB (ref 8.9–10.3)
CALCIUM: 8.1 mg/dL — AB (ref 8.9–10.3)
CO2: 19 mmol/L — ABNORMAL LOW (ref 22–32)
CO2: 20 mmol/L — AB (ref 22–32)
Chloride: 121 mmol/L — ABNORMAL HIGH (ref 101–111)
Chloride: 118 mmol/L — ABNORMAL HIGH (ref 101–111)
Creatinine, Ser: 1.02 mg/dL (ref 0.61–1.24)
Creatinine, Ser: 1.06 mg/dL (ref 0.61–1.24)
GFR calc Af Amer: >60 mL/min (ref >60)
GLUCOSE: 128 mg/dL — AB (ref 65–99)
Glucose, Bld: 172 mg/dL — ABNORMAL HIGH (ref 65–99)
Potassium: 3.1 mmol/L — ABNORMAL LOW (ref 3.5–5.1)
Potassium: 3.5 mmol/L (ref 3.5–5.1)
SODIUM: 144 mmol/L (ref 135–145)
Sodium: 147 mmol/L — ABNORMAL HIGH (ref 135–145)

## 2016-07-19 LAB — POCT I-STAT 3, ART BLOOD GAS (G3+)
ACID-BASE DEFICIT: 4 mmol/L — AB (ref 0.0–2.0)
Acid-base deficit: 5 mmol/L — ABNORMAL HIGH (ref 0.0–2.0)
Bicarbonate: 20.1 mmol/L (ref 20.0–28.0)
Bicarbonate: 20.6 mmol/L (ref 20.0–28.0)
O2 SAT: 100 %
O2 SAT: 100 %
PCO2 ART: 34.4 mmHg (ref 32.0–48.0)
PCO2 ART: 33.2 mmHg (ref 32.0–48.0)
PH ART: 7.375 (ref 7.350–7.450)
PH ART: 7.402 (ref 7.350–7.450)
TCO2: 21 mmol/L (ref 0–100)
TCO2: 22 mmol/L (ref 0–100)
pO2, Arterial: 240 mmHg — ABNORMAL HIGH (ref 83.0–108.0)
pO2, Arterial: 260 mmHg — ABNORMAL HIGH (ref 83.0–108.0)

## 2016-07-19 LAB — RAPID URINE DRUG SCREEN, HOSP PERFORMED
Amphetamines: POSITIVE — AB
BARBITURATES: NOT DETECTED
BENZODIAZEPINES: NOT DETECTED
Cocaine: NOT DETECTED
Opiates: NOT DETECTED
Tetrahydrocannabinol: NOT DETECTED

## 2016-07-19 LAB — ECHOCARDIOGRAM COMPLETE
AOASC: 29 cm
CHL CUP DOP CALC LVOT VTI: 17.6 cm
E decel time: 155 msec
E/e' ratio: 8.19
FS: 28 % (ref 28–44)
IVS/LV PW RATIO, ED: 0.77
LA ID, A-P, ES: 22 mm
LA diam end sys: 22 mm
LA diam index: 0.97 cm/m2
LA vol index: 10.3 mL/m2
LAVOL: 23.2 mL
LAVOLA4C: 15.9 mL
LDCA: 4.15 cm2
LV E/e' medial: 8.19
LV E/e'average: 8.19
LV TDI E'LATERAL: 13.8
LV e' LATERAL: 13.8 cm/s
LVOTD: 23 mm
LVOTPV: 100 cm/s
LVOTSV: 73 mL
MV Dec: 155
MV pk E vel: 113 m/s
MVPG: 5 mmHg
PW: 14 mm — AB (ref 0.6–1.1)
RV LATERAL S' VELOCITY: 17.2 cm/s
S' Lateral: 8.49 cm/s
TAPSE: 21.4 mm
TDI e' medial: 13.2

## 2016-07-19 LAB — CBC
HCT: 30.1 % — ABNORMAL LOW (ref 39.0–52.0)
HCT: 28.2 % — ABNORMAL LOW (ref 39.0–52.0)
Hemoglobin: 9.8 g/dL — ABNORMAL LOW (ref 13.0–17.0)
Hemoglobin: 9.2 g/dL — ABNORMAL LOW (ref 13.0–17.0)
MCH: 28.4 pg (ref 26.0–34.0)
MCH: 28.4 pg (ref 26.0–34.0)
MCHC: 32.6 g/dL (ref 30.0–36.0)
MCHC: 32.6 g/dL (ref 30.0–36.0)
MCV: 87.2 fL (ref 78.0–100.0)
MCV: 87 fL (ref 78.0–100.0)
PLATELETS: 91 10*3/uL — AB (ref 150–400)
Platelets: 89 10*3/uL — ABNORMAL LOW (ref 150–400)
RBC: 3.45 MIL/uL — AB (ref 4.22–5.81)
RBC: 3.24 MIL/uL — ABNORMAL LOW (ref 4.22–5.81)
RDW: 14.7 % (ref 11.5–15.5)
RDW: 14.7 % (ref 11.5–15.5)
WBC: 11.3 10*3/uL — ABNORMAL HIGH (ref 4.0–10.5)
WBC: 10.7 10*3/uL — AB (ref 4.0–10.5)

## 2016-07-19 LAB — BLOOD GAS, ARTERIAL
ACID-BASE DEFICIT: 6.1 mmol/L — AB (ref 0.0–2.0)
ACID-BASE DEFICIT: 6.1 mmol/L — AB (ref 0.0–2.0)
Bicarbonate: 18.2 mmol/L — ABNORMAL LOW (ref 20.0–28.0)
Bicarbonate: 18.2 mmol/L — ABNORMAL LOW (ref 20.0–28.0)
DRAWN BY: 313061
DRAWN BY: 257881
FIO2: 40
FIO2: 40
LHR: 18 {breaths}/min
LHR: 18 {breaths}/min
O2 SAT: 99.2 %
O2 SAT: 99.2 %
PCO2 ART: 32.1 mmHg (ref 32.0–48.0)
PCO2 ART: 32.1 mmHg (ref 32.0–48.0)
PEEP/CPAP: 5 cmH2O
PEEP: 5 cmH2O
PH ART: 7.371 (ref 7.350–7.450)
PO2 ART: 231 mmHg — AB (ref 83.0–108.0)
PO2 ART: 231 mmHg — AB (ref 83.0–108.0)
Patient temperature: 98.6
Patient temperature: 98.6
VT: 620 mL
VT: 620 mL
pH, Arterial: 7.371 (ref 7.350–7.450)

## 2016-07-19 LAB — URINALYSIS, ROUTINE W REFLEX MICROSCOPIC
BILIRUBIN URINE: NEGATIVE
Glucose, UA: NEGATIVE mg/dL
HGB URINE DIPSTICK: NEGATIVE
Ketones, ur: 20 mg/dL — AB
Leukocytes, UA: NEGATIVE
Nitrite: NEGATIVE
PH: 5 (ref 5.0–8.0)
Protein, ur: NEGATIVE mg/dL
SPECIFIC GRAVITY, URINE: 1.023 (ref 1.005–1.030)

## 2016-07-19 LAB — PHOSPHORUS: Phosphorus: 1.5 mg/dL — ABNORMAL LOW (ref 2.5–4.6)

## 2016-07-19 LAB — URINE CULTURE
CULTURE: NO GROWTH
SPECIAL REQUESTS: NORMAL

## 2016-07-19 LAB — MAGNESIUM: Magnesium: 1.9 mg/dL (ref 1.7–2.4)

## 2016-07-19 SURGERY — SURGICAL PROCUREMENT, ORGAN
Anesthesia: General

## 2016-07-19 MED ORDER — HEPARIN BOLUS VIA INFUSION
300.0000 [IU] | Freq: Once | INTRAVENOUS | Status: DC
  Filled 2016-07-19 (×2): qty 300

## 2016-07-19 MED ORDER — SODIUM CHLORIDE 0.9 % IV SOLN
0.0000 ug/min | INTRAVENOUS | Status: DC
  Administered 2016-07-19: 120 ug/min via INTRAVENOUS
  Filled 2016-07-19: qty 4

## 2016-07-19 MED ORDER — IPRATROPIUM-ALBUTEROL 0.5-2.5 (3) MG/3ML IN SOLN
RESPIRATORY_TRACT | Status: AC
  Administered 2016-07-19: 3 mL via RESPIRATORY_TRACT
  Filled 2016-07-19: qty 3

## 2016-07-19 MED ORDER — IPRATROPIUM-ALBUTEROL 0.5-2.5 (3) MG/3ML IN SOLN
3.0000 mL | Freq: Once | RESPIRATORY_TRACT | Status: AC
  Administered 2016-07-19: 3 mL via RESPIRATORY_TRACT

## 2016-07-19 MED ORDER — HEPARIN SODIUM (PORCINE) 1000 UNIT/ML IJ SOLN
30000.0000 [IU] | Freq: Once | INTRAMUSCULAR | Status: DC
  Filled 2016-07-19: qty 30

## 2016-07-19 MED ORDER — DEXTROSE 5 % IV SOLN
50.0000 mg | INTRAVENOUS | Status: DC
  Filled 2016-07-19 (×2): qty 50

## 2016-07-19 MED ORDER — VANCOMYCIN HCL IN DEXTROSE 1-5 GM/200ML-% IV SOLN
1000.0000 mg | INTRAVENOUS | Status: AC
Start: 2016-07-19 — End: 2016-07-20
  Administered 2016-07-19: 1000 mg via INTRAVENOUS
  Filled 2016-07-19: qty 200

## 2016-07-19 MED ORDER — PIPERACILLIN-TAZOBACTAM 3.375 G IVPB 30 MIN
3.3750 g | INTRAVENOUS | Status: AC
  Administered 2016-07-19: 3.375 g via INTRAVENOUS
  Filled 2016-07-19: qty 50

## 2016-07-19 MED ORDER — POTASSIUM PHOSPHATES 15 MMOLE/5ML IV SOLN
40.0000 mmol | Freq: Once | INTRAVENOUS | Status: AC
  Administered 2016-07-19: 40 mmol via INTRAVENOUS
  Filled 2016-07-19: qty 13.33

## 2016-07-19 MED ORDER — HEPARIN SODIUM (PORCINE) 1000 UNIT/ML IJ SOLN: 300.0000 [IU] | Freq: Once | INTRAMUSCULAR | Status: DC

## 2016-07-19 MED ORDER — MAGNESIUM SULFATE 2 GM/50ML IV SOLN
2.0000 g | Freq: Once | INTRAVENOUS | Status: AC
  Administered 2016-07-19: 2 g via INTRAVENOUS
  Filled 2016-07-19: qty 50

## 2016-07-19 MED ORDER — VECURONIUM BOLUS VIA INFUSION
10.0000 mg | Freq: Once | INTRAVENOUS | Status: DC
  Filled 2016-07-19 (×2): qty 10

## 2016-07-19 SURGICAL SUPPLY — 81 items
BLADE 10 SAFETY STRL DISP (BLADE) ×3 IMPLANT
BLADE CLIPPER SURG (BLADE) IMPLANT
BLADE STERNUM SYSTEM 6 (BLADE) ×3 IMPLANT
BLADE SURG 10 STRL SS (BLADE) ×6 IMPLANT
CANNULA VESSEL W/WING WO/VALVE (CANNULA) IMPLANT
CLIP TI MEDIUM 24 (CLIP) ×3 IMPLANT
CLIP TI WIDE RED SMALL 24 (CLIP) ×3 IMPLANT
CONT SPEC STER OR (MISCELLANEOUS) ×12 IMPLANT
COVER BACK TABLE 60X90IN (DRAPES) ×3 IMPLANT
COVER MAYO STAND STRL (DRAPES) ×3 IMPLANT
COVER SURGICAL LIGHT HANDLE (MISCELLANEOUS) ×3 IMPLANT
DRAPE PROXIMA HALF (DRAPES) IMPLANT
DRAPE SLUSH MACHINE 52X66 (DRAPES) ×3 IMPLANT
DRAPE SLUSH/WARMER DISC (DRAPES) ×6 IMPLANT
DRESSING TELFA 8X3 (GAUZE/BANDAGES/DRESSINGS) ×3 IMPLANT
DRSG COVADERM 4X10 (GAUZE/BANDAGES/DRESSINGS) ×6 IMPLANT
DURAPREP 26ML APPLICATOR (WOUND CARE) ×6 IMPLANT
ELECT BLADE 6.5 EXT (BLADE) IMPLANT
ELECT REM PT RETURN 9FT ADLT (ELECTROSURGICAL) ×6
ELECTRODE REM PT RTRN 9FT ADLT (ELECTROSURGICAL) ×2 IMPLANT
GAUZE SPONGE 4X4 16PLY XRAY LF (GAUZE/BANDAGES/DRESSINGS) IMPLANT
GLOVE BIO SURGEON STRL SZ 6.5 (GLOVE) ×8 IMPLANT
GLOVE BIO SURGEON STRL SZ7 (GLOVE) ×12 IMPLANT
GLOVE BIO SURGEON STRL SZ8 (GLOVE) ×12 IMPLANT
GLOVE BIO SURGEONS STRL SZ 6.5 (GLOVE) ×4
GLOVE BIOGEL PI IND STRL 6 (GLOVE) ×4 IMPLANT
GLOVE BIOGEL PI IND STRL 7.0 (GLOVE) ×2 IMPLANT
GLOVE BIOGEL PI IND STRL 8 (GLOVE) ×4 IMPLANT
GLOVE BIOGEL PI INDICATOR 6 (GLOVE) ×8
GLOVE BIOGEL PI INDICATOR 7.0 (GLOVE) ×4
GLOVE BIOGEL PI INDICATOR 8 (GLOVE) ×8
GOWN STRL REUS W/ TWL LRG LVL3 (GOWN DISPOSABLE) ×5 IMPLANT
GOWN STRL REUS W/ TWL XL LVL3 (GOWN DISPOSABLE) ×5 IMPLANT
GOWN STRL REUS W/TWL LRG LVL3 (GOWN DISPOSABLE) ×15
GOWN STRL REUS W/TWL XL LVL3 (GOWN DISPOSABLE) ×15
KIT POST MORTEM ADULT 36X90 (BAG) ×3 IMPLANT
KIT ROOM TURNOVER OR (KITS) ×3 IMPLANT
LOOP VESSEL MAXI BLUE (MISCELLANEOUS) ×3 IMPLANT
LOOP VESSEL MINI RED (MISCELLANEOUS) ×3 IMPLANT
MANIFOLD NEPTUNE II (INSTRUMENTS) IMPLANT
NEEDLE BIOPSY 14X6 SOFT TISS (NEEDLE) ×3 IMPLANT
NS IRRIG 1000ML POUR BTL (IV SOLUTION) IMPLANT
PACK AORTA (CUSTOM PROCEDURE TRAY) ×3 IMPLANT
PAD ARMBOARD 7.5X6 YLW CONV (MISCELLANEOUS) ×6 IMPLANT
PENCIL BUTTON HOLSTER BLD 10FT (ELECTRODE) ×3 IMPLANT
RELOAD AUTO 90-3.5 TA90 BLE (ENDOMECHANICALS) ×3 IMPLANT
RELOAD PROXIMATE 75MM BLUE (ENDOMECHANICALS) ×3 IMPLANT
SCISSORS HARMONIC WAVE 18CM (INSTRUMENTS) ×3 IMPLANT
SHEARS FOC LG CVD HARMONIC 17C (MISCELLANEOUS) ×3 IMPLANT
SOLUTION BETADINE 4OZ (MISCELLANEOUS) ×3 IMPLANT
SPONGE INTESTINAL PEANUT (DISPOSABLE) ×6 IMPLANT
SPONGE LAP 18X18 X RAY DECT (DISPOSABLE) IMPLANT
STAPLER 90 3.5 STAND SLIM (STAPLE) ×3
STAPLER 90 3.5 STD SLIM (STAPLE) ×1 IMPLANT
STAPLER PROXIMATE 75MM BLUE (STAPLE) ×3 IMPLANT
STAPLER VISISTAT 35W (STAPLE) ×3 IMPLANT
SUCTION POOLE TIP (SUCTIONS) ×6 IMPLANT
SUT BONE WAX W31G (SUTURE) ×3 IMPLANT
SUT ETHIBOND 5 LR DA (SUTURE) IMPLANT
SUT ETHILON 1 LR 30 (SUTURE) ×15 IMPLANT
SUT ETHILON 2 LR (SUTURE) IMPLANT
SUT PROLENE 6 0 BV (SUTURE) IMPLANT
SUT SILK 0 TIES 10X30 (SUTURE) ×3 IMPLANT
SUT SILK 1 SH (SUTURE) IMPLANT
SUT SILK 1 TIES 10X30 (SUTURE) IMPLANT
SUT SILK 2 0 (SUTURE)
SUT SILK 2 0 SH (SUTURE) IMPLANT
SUT SILK 2 0 SH CR/8 (SUTURE) IMPLANT
SUT SILK 2 0 TIES 10X30 (SUTURE) ×3 IMPLANT
SUT SILK 2-0 18XBRD TIE 12 (SUTURE) IMPLANT
SUT SILK 3 0 TIES 10X30 (SUTURE) IMPLANT
SWAB COLLECTION DEVICE MRSA (MISCELLANEOUS) IMPLANT
SYRINGE TOOMEY DISP (SYRINGE) ×3 IMPLANT
TAPE UMBILICAL 1/8 X36 TWILL (MISCELLANEOUS) ×6 IMPLANT
TOWEL OR 17X24 6PK STRL BLUE (TOWEL DISPOSABLE) ×3 IMPLANT
TOWEL OR 17X26 10 PK STRL BLUE (TOWEL DISPOSABLE) ×6 IMPLANT
TUBE ANAEROBIC SPECIMEN COL (MISCELLANEOUS) IMPLANT
TUBE CONNECTING 12'X1/4 (SUCTIONS) ×1
TUBE CONNECTING 12X1/4 (SUCTIONS) ×2 IMPLANT
WATER STERILE IRR 1000ML POUR (IV SOLUTION) IMPLANT
YANKAUER SUCT BULB TIP NO VENT (SUCTIONS) ×3 IMPLANT

## 2016-07-19 NOTE — Progress Notes (Signed)
No involvement at this point.  Patient to be a donor later today.  Taylor Steele. Gae Bon, MD, FACS [phone] Trauma Surgeon

## 2016-07-19 NOTE — Progress Notes (Signed)
RT was asked by CDS to do a recruitment maneuver on patient. RT followed recruitment maneuver protocol. Patient placed on pressure control PC 40 RR 10, I Time of 3.00sec, 100% FIO2 and PEEP of 5 for . RT placed patient on 100% and PEEP 5 after recruitment and asked to obtain an ABG after. Ordered by CDS.

## 2016-07-19 NOTE — Progress Notes (Signed)
RT was asked by CDS to perform another recruitment maneuver on patient. RT was given the following instructions placed patient on CPAP and turn PEEP up between 25 and 40 what ever the patient could tolerate and turn pressure support to 0. Do that for 30 seconds. RT placed patient back on 100% and PEEP of 5 per CDS orders. ABG will be obtained in .

## 2016-07-19 NOTE — Progress Notes (Signed)
Patient's mother, Jafet Wissing, asked if she could take pictures of and video tape some of her son's echocardiogram.  "I just want to be able to see and hear my son's heart beat after he is gone."  We expressed our concern that he could not give consent and asked that she not share it online. The echo tech, Sheralyn Boatman, was aware of taping as well. Brach Birdsall C

## 2016-07-19 NOTE — Progress Notes (Addendum)
  Echocardiogram 2D Echocardiogram has been performed. Mother used video to record echo images and sound during test.  Janalyn Harder 08/01/2016, 9:15 AM

## 2016-07-19 NOTE — Progress Notes (Signed)
Per CDS Duoneb was given and recruitment maneuver was performed immediately after per their protocol.  PEEP set to 25 with 0 of PS.  This was tolerated well so PEEP advanced to 30 after 20 seconds.  PEEP of 30 was tolerated well so PEEP was advanced to 35 after approximately 10 seconds the pts systolic blood pressure dropped about 10 points.  Recruitment maneuver was ended and pt was placed back on full support.  His blood pressure came back to his normal range.  CDS rep made aware of this.  Pt left on 100% and will draw ABG in 30 minutes per CDS protocol.

## 2016-07-19 NOTE — Procedures (Signed)
Video Bronchoscopy Procedure Note  Pre-Procedure Diagnoses: 1.  Acute hypoxic respiratory failure 2.  Brain dead  Post-Procedure Diagnoses: 1.  Mucus plugging  2.  Acute hypoxic respiratory failure 3.  Brain dead  Procedures Performed: 1. Bronchoscopy with Airway Inspection 2. Bronchoalveolar Lavage of Lingula & Right Middle Lobes  Indication for Procedure:  Requested by [facility] for evaluation for possible lung harvest & transplant.  Medications Administered During Procedure:  Refer to electronic medical record and nursing documentation.  Pre-Procedure Physical Exam: General:  No acute distress. Eyes closed. Family have just left room. HEENT:  Moist mucus membranes. Endotracheal tube in place. Cardiovascular:  Regular rhythm. No appreciable JVD. Pulmonary:  Symmetrically chest wall rise on ventilator. Abdomen:  Soft. Protuberant. Neurological:  No sedation. No spontaneous movements.   Description of Procedure: Procedure was performed at bedside in the ICU.  A time out was performed to identify the correct patient and procedure.  Flexible bronchoscope was then inserted into endotracheal tube. The bronchoscope was then advanced into the trachea with ease.  An airway inspeciton was performed finding mucus plugging present within bilateral lower lobes.  Mucous plugs were easily suctioned from the patient's airways revealing thin, tan secretions with some particulate material that suggested possible source as gastric contents. The patient's mucosa was normal without erythema, ulceration, or abnormality. Accessory segments were noted on the left in particular the left lower lobe superior segment with an accessory lobe. The airways to the left upper lobe and lingula were quite short and there appeared to be an additional accessory segment within the lingula. The bronchoscope was then advanced into the right middle lobe.  A lavage was performed by instilling a total of 20 cc of  sterile saline and aspirating a total of 8 cc of cloudy fluid.  The bronchoscope was then repositioned into the lingula. A lavage was performed by instilling a total of 20 mL of sterile saline and aspirating a total of 14 mL of cloudy fluid. The flexible bronchoscope was then removed from the patient's airways after suctioning of the remaining secretions.  At the end of the procedure the disposable flexible bronchoscope was examined and found to be completely intact.  Blood Loss:  None.  Complications:  None.  Bronchoalveolar Lavage: 1. Performed on the Right Middle Lobe:  Sent for cell count and quantitative culture. 2. Performed on the Lingula:  Sent for cell count and quantitative culture.  Post Procedure Instructions: 1. Patient to remain in ventilator support. [facility] to follow-up on results of culture and cell counts.

## 2016-07-20 ENCOUNTER — Inpatient Hospital Stay (HOSPITAL_COMMUNITY): Payer: Medicaid Other | Admitting: Certified Registered"

## 2016-07-20 ENCOUNTER — Encounter (HOSPITAL_COMMUNITY): Payer: Medicaid Other | Admitting: Certified Registered"

## 2016-07-20 ENCOUNTER — Encounter (HOSPITAL_COMMUNITY): Payer: Self-pay

## 2016-07-20 LAB — HEMOGLOBIN A1C
Hgb A1c MFr Bld: 4.8 % (ref 4.8–5.6)
MEAN PLASMA GLUCOSE: 91 mg/dL

## 2016-07-20 MED ORDER — AMPHOTERICIN B 50 MG IJ SOLR
50.0000 mg | Freq: Once | INTRAMUSCULAR | Status: DC
  Filled 2016-07-20: qty 50

## 2016-07-20 MED ORDER — HEPARIN SODIUM (PORCINE) 1000 UNIT/ML IJ SOLN
INTRAMUSCULAR | Status: AC
  Filled 2016-07-20: qty 1

## 2016-07-20 MED ORDER — HEPARIN SODIUM (PORCINE) 1000 UNIT/ML IJ SOLN
INTRAMUSCULAR | Status: DC | PRN
  Administered 2016-07-20: 30000 [IU] via INTRAVENOUS

## 2016-07-20 MED ORDER — AMPHOTERICIN B LIPOSOME 50 MG IV SUSR
50.0000 mg | Freq: Once | INTRAVENOUS | Status: DC
  Filled 2016-07-20: qty 50

## 2016-07-20 MED ORDER — ROCURONIUM BROMIDE 100 MG/10ML IV SOLN
INTRAVENOUS | Status: DC | PRN
  Administered 2016-07-20 (×2): 50 mg via INTRAVENOUS

## 2016-07-20 MED ORDER — LACTATED RINGERS IV SOLN
INTRAVENOUS | Status: DC | PRN
Start: 2016-07-20 — End: 2016-07-20
  Administered 2016-07-20 (×2): via INTRAVENOUS

## 2016-07-20 NOTE — Transfer of Care (Signed)
Immediate Anesthesia Transfer of Care Note  Patient: Taylor Steele  Procedure(s) Performed: Procedure(s): ORGAN PROCUREMENT (N/A)

## 2016-07-20 NOTE — Anesthesia Preprocedure Evaluation (Signed)
Anesthesia Evaluation  Patient identified by MRN, date of birth, ID band Patient unresponsive    Reviewed: Patient's Chart, lab work & pertinent test results, Unable to perform ROS - Chart review only  Airway Mallampati: Intubated       Dental   Pulmonary Current Smoker,    + rhonchi        Cardiovascular  Rhythm:Regular Rate:Tachycardia     Neuro/Psych    GI/Hepatic   Endo/Other    Renal/GU      Musculoskeletal   Abdominal   Peds  Hematology   Anesthesia Other Findings   Reproductive/Obstetrics                             Anesthesia Physical Anesthesia Plan  ASA: VI  Anesthesia Plan: General   Post-op Pain Management:    Induction: Intravenous  Airway Management Planned: Oral ETT  Additional Equipment: Arterial line  Intra-op Plan:   Post-operative Plan:   Informed Consent: I have reviewed the patients History and Physical, chart, labs and discussed the procedure including the risks, benefits and alternatives for the proposed anesthesia with the patient or authorized representative who has indicated his/her understanding and acceptance.     Plan Discussed with: CRNA and Anesthesiologist  Anesthesia Plan Comments:         Anesthesia Quick Evaluation

## 2016-07-21 ENCOUNTER — Encounter: Payer: Self-pay | Admitting: Emergency Medicine

## 2016-07-21 LAB — CULTURE, BAL-QUANTITATIVE

## 2016-07-21 LAB — CULTURE, BAL-QUANTITATIVE W GRAM STAIN
Culture: >=100000 — AB
Special Requests: NORMAL

## 2016-07-22 LAB — BPAM RBC
BLOOD PRODUCT EXPIRATION DATE: 201805012359
BLOOD PRODUCT EXPIRATION DATE: 201805012359
Blood Product Expiration Date: 201804302359
Blood Product Expiration Date: 201805012359
UNIT TYPE AND RH: 5100
UNIT TYPE AND RH: 5100
Unit Type and Rh: 5100
Unit Type and Rh: 5100

## 2016-07-22 LAB — TYPE AND SCREEN
ABO/RH(D): O POS
Antibody Screen: NEGATIVE
UNIT DIVISION: 0
UNIT DIVISION: 0
Unit division: 0
Unit division: 0

## 2016-07-22 NOTE — Anesthesia Postprocedure Evaluation (Signed)
Anesthesia Post Note  Patient: Taylor Steele  Procedure(s) Performed: Procedure(s) (LRB): ORGAN PROCUREMENT (N/A)  Patient location during evaluation: Other Anesthesia Type: General Anesthetic complications: no Comments: Patient brain dead for organ harvest. Patient expired after organs removed, ventilator turned off.       Last Vitals:  Vitals:   07/16/2016 2321 08/06/2016 0000  BP:  118/70  Pulse: (!) 103 (!) 103  Resp: 18 18  Temp: 36.7 C 36.7 C    Last Pain:  Vitals:   07/22/16 0600  TempSrc: Core (Comment)                 Alandis Bluemel COKER

## 2016-07-23 LAB — CULTURE, BLOOD (ROUTINE X 2)
CULTURE: NO GROWTH
CULTURE: NO GROWTH
SPECIAL REQUESTS: ADEQUATE
Special Requests: ADEQUATE

## 2016-08-02 MED FILL — Medication: Qty: 1 | Status: AC

## 2016-08-15 NOTE — Procedures (Signed)
Central Venous Catheter Insertion Procedure Note Taylor Steele [id number] 1985/11/25  Procedure: Insertion of Central Venous Catheter Indications: Assessment of intravascular volume, Drug and/or fluid administration and Frequent blood sampling  Procedure Details Consent: Risks of procedure as well as the alternatives and risks of each were explained to the (patient/caregiver).  Consent for procedure obtained. Time Out: Verified patient identification, verified procedure, site/side was marked, verified correct patient position, special equipment/implants available, medications/allergies/relevent history reviewed, required imaging and test results available.  Performed  Maximum sterile technique was used including antiseptics, cap, gloves, gown, hand hygiene, mask and sheet. Skin prep: Chlorhexidine; local anesthetic administered A antimicrobial bonded/coated triple lumen catheter was placed in the right femoral vein due to multiple attempts, no other available access using the Seldinger technique.  Evaluation Blood flow good Complications: No apparent complications Patient did tolerate procedure well. Chest X-ray ordered to verify placement.  CXR: pending.  Jovita Kussmaul, AG-ACNP [facility]  Pgr: [phone]  PCCM Pgr: [phone]

## 2016-08-15 NOTE — Progress Notes (Signed)
Patient ID: SIAOSI ALTER, male   DOB: [birth year], 31 y.o.   MRN: [id number] Nuclear medicine brain flow study reviewed with radiologist. It shows no flow. Patient is brain dead. Time of death 16. I spoke with his family.  Violeta Gelinas, MD, MPH, FACS Trauma: [phone] General Surgery: [phone]

## 2016-08-15 NOTE — Procedures (Signed)
Arterial Catheter Insertion Procedure Note Taylor Steele [id number] [birth year]  Procedure: Insertion of Arterial Catheter  Indications: Blood pressure monitoring and Frequent blood sampling  Procedure Details Consent: Risks of procedure as well as the alternatives and risks of each were explained to the (patient/caregiver).  Consent for procedure obtained. Time Out: Verified patient identification, verified procedure, site/side was marked, verified correct patient position, special equipment/implants available, medications/allergies/relevent history reviewed, required imaging and test results available.  Performed  Maximum sterile technique was used including antiseptics, cap, gloves, gown, hand hygiene, mask and sheet. Skin prep: Chlorhexidine; local anesthetic administered 20 gauge catheter was inserted into left femoral artery using the Seldinger technique.  Evaluation Blood flow good; BP tracing good. Complications: No apparent complications.   Jovita Kussmaul, AG-ACNP [facility]  Pgr: [phone]  PCCM Pgr: [phone]

## 2016-08-15 NOTE — Progress Notes (Signed)
RT attempted left radial arterial line multiple times without success.  RN and CDS aware.
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
## 2016-08-15 DEATH — deceased
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
## 2018-04-17 IMAGING — DX DG CHEST 1V PORT
1 series · 1 of 1 positions shown · non-contrast
Comparison: 07/17/2016

CLINICAL DATA: Respiratory failure.  Endotracheal tube positioning.

EXAM:
PORTABLE CHEST 1 VIEW

[chest ap]
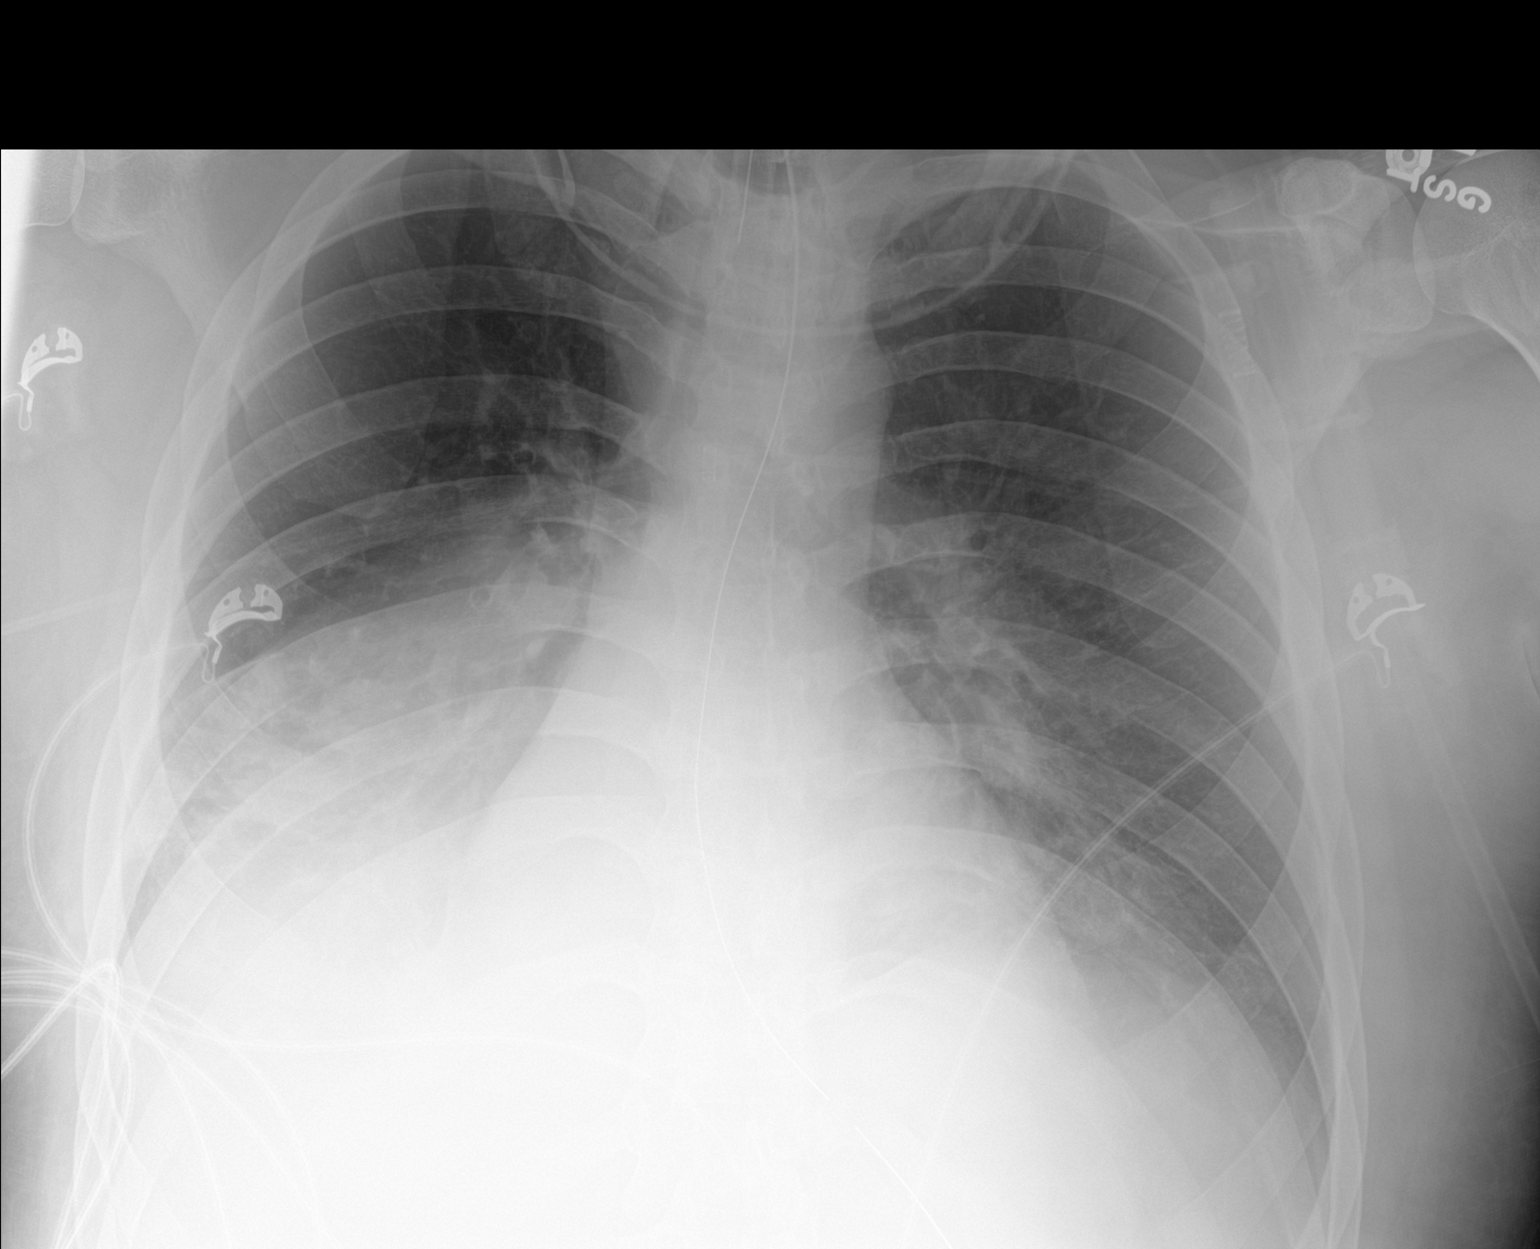

[1 of 1 positions shown; findings below may reference images not displayed]

FINDINGS: Repositioned endotracheal tube tip is now 5.2 cm above the carina.
Pulmonary consolidation at the right lung base is new since prior
exam. Improved aeration of the left lung status post repositioning
of endotracheal tube. Gastric tube extends to the left upper
quadrant of the abdomen with the side-port just below the expected
location of the GE junction.
IMPRESSION: 1. Satisfactory repositioning of the endotracheal tube with tip now
5.2 cm above the carina. Gastric tube is seen in the expected
location the stomach with side port just below the expected location
of the GE junction.
2. New hazy consolidation at the right lung base.
